# Patient Record
Sex: Male | Born: 1995
Health system: Southern US, Community
[De-identification: ages and names within clinical notes are randomized; demographics above are authoritative.]

## PROBLEM LIST (undated history)

## (undated) DIAGNOSIS — R809 Proteinuria, unspecified: Secondary | ICD-10-CM

## (undated) DIAGNOSIS — Z87898 Personal history of other specified conditions: Secondary | ICD-10-CM

## (undated) HISTORY — DX: Proteinuria, unspecified: R80.9

## (undated) HISTORY — DX: Personal history of other specified conditions: Z87.898

---
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
## 2014-03-03 ENCOUNTER — Telehealth: Payer: Self-pay | Admitting: General Practice

## 2014-03-03 NOTE — Telephone Encounter (Signed)
She will have to bring the form to the appt. Typically these forms are too detailed to complete at the visit. However, Dr. Reece AgarG will get it done and I will have to call her once it's completed.  Would you mind letting her know?

## 2014-03-03 NOTE — Telephone Encounter (Signed)
LVM for Mom Nancy to call 

## 2014-03-03 NOTE — Telephone Encounter (Signed)
LVM for Mom Harriett Sineancy to call

## 2014-03-03 NOTE — Telephone Encounter (Signed)
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 He has a history of epilepsy but has not had any issues in several years and this form needs to be done within 30 days. Mom is requesting a call back to make sure this is ok. I can call her if needed.

## 2014-03-11 ENCOUNTER — Encounter: Payer: Self-pay | Admitting: Family Medicine

## 2014-03-11 ENCOUNTER — Ambulatory Visit (INDEPENDENT_AMBULATORY_CARE_PROVIDER_SITE_OTHER): Payer: BC Managed Care – PPO | Admitting: Family Medicine

## 2014-03-11 ENCOUNTER — Encounter (INDEPENDENT_AMBULATORY_CARE_PROVIDER_SITE_OTHER): Payer: Self-pay

## 2014-03-11 VITALS — BP 100/60 | HR 84 | Temp 98.4°F | Ht 73.25 in | Wt 171.0 lb

## 2014-03-11 DIAGNOSIS — Z113 Encounter for screening for infections with a predominantly sexual mode of transmission: Secondary | ICD-10-CM

## 2014-03-11 DIAGNOSIS — J3089 Other allergic rhinitis: Secondary | ICD-10-CM | POA: Insufficient documentation

## 2014-03-11 DIAGNOSIS — Z8669 Personal history of other diseases of the nervous system and sense organs: Secondary | ICD-10-CM

## 2014-03-11 DIAGNOSIS — Z87898 Personal history of other specified conditions: Secondary | ICD-10-CM | POA: Insufficient documentation

## 2014-03-11 DIAGNOSIS — R809 Proteinuria, unspecified: Secondary | ICD-10-CM

## 2014-03-11 DIAGNOSIS — J309 Allergic rhinitis, unspecified: Secondary | ICD-10-CM

## 2014-03-11 DIAGNOSIS — Z23 Encounter for immunization: Secondary | ICD-10-CM

## 2014-03-11 DIAGNOSIS — Z0000 Encounter for general adult medical examination without abnormal findings: Secondary | ICD-10-CM

## 2014-03-11 LAB — POCT URINALYSIS DIPSTICK
Bilirubin, UA: NEGATIVE
Blood, UA: NEGATIVE
Glucose, UA: NEGATIVE
Ketones, UA: NEGATIVE
Leukocytes, UA: NEGATIVE
Nitrite, UA: NEGATIVE
Protein, UA: NEGATIVE
Spec Grav, UA: 1.025
Urobilinogen, UA: NEGATIVE
pH, UA: 6.5

## 2014-03-11 NOTE — Assessment & Plan Note (Addendum)
Preventative protocols reviewed and updated unless pt declined. Flu and meningitis shots today. Discussed healthy diet and lifestyle.  With mom out of room denies significant alcohol use. High risk sexual activity - discussed. [facility]. Will return for HIV testing. Will return for fasting cbg, FLP.

## 2014-03-11 NOTE — Assessment & Plan Note (Signed)
rec claritin D or zyrtec D. Prone to nose bleeds so will not prescribe INS

## 2014-03-11 NOTE — Progress Notes (Signed)
Pre visit review using our clinic review tool, if applicable. No additional management support is needed unless otherwise documented below in the visit note. 

## 2014-03-11 NOTE — Assessment & Plan Note (Signed)
Seizure free since 2008, will fill out DMV paperwork today.

## 2014-03-11 NOTE — Patient Instructions (Addendum)
Flu shot today Meningitis shot today. For yearlong allergies - try zyrtec D or claritin D daily along with nasal saline irrigation. Come in fasting for labs to check sugar and cholesterol. Return as needed or in 1 year for next checkup

## 2014-03-11 NOTE — Progress Notes (Signed)
BP 100/60 mmHg  Pulse 84  Temp(Src) 98.4 F (36.9 C) (Tympanic)  Ht 6' 1.25" (1.861 m)  Wt 171 lb (77.565 kg)  BMI 22.40 kg/m2  SpO2 98%   CC: new pt to establish care  Subjective:    Patient ID: Kristopher Davidson, male    DOB: 03/23/1996, 18 y.o.   MRN: [id number]  HPI: Kristopher Davidson is a 18 y.o. male presenting on 03/11/2014 for Establish Care   Presents with mom Harriett SineNancy. Prior Pediatrician was Dr Cherie OuchNogo.    H/o complex partial epilepsy onset 2004 started carbamazepine 2007, weaned off carbamazepine 01/2010 per neurology recs ([facility] released [facility]). Seizure free since 2009. Requests DMV form filled out today.   H/o hypoglycemia and proteinuria during middle school, no problems since then.   Constant sinus pressure - takes pseudophed, mucinex, claritin. + RN, sneezing, drainage. Yearlong issues.   Going [employer] - [employer]. Wants to study exercise science.  H/o chicken pox as a 6 mo infant. Never checked titer.  May be due for meningitis shot today.   With mom out of room - denies smoking. Denies rec drugs. No significant alcohol use. Sexually active, 5 partners in last year. Has been tested for CT/GC several months ago. No HIV test in past.   Preventative: Flu shot - today Seat belt use discussed Sunscreen use discussed.   [place]  [employer] studying exercise physiology Activity: gym, track and swimming Diet: good water, fruits/vegetables  From last [facility] note: MRI 1/07 There is no midline shift or mass lesion. There is no evidence of intracranial hemorrhage or infarct. No focal parenchymal signal abnormality is identified. There are no extra-axial fluid collections present. No diffusion weighted signal abnormality is identified. IMPRESSION: Normal MRI of the brain.  Medical/Neurophysiology EEG 04/11/05 This routine EEG, recorded with the patient awake and drowsy, was normal. No epileptiform abnormalities were seen. 9/05 72 hour  EEG normal 9/04 72 hour EEG normal [id number] Routine EEG normal    Relevant past medical, surgical, family and social history reviewed and updated as indicated. Interim medical history since our last visit reviewed. Allergies and medications reviewed and updated.  No current outpatient prescriptions on file prior to visit.   No current facility-administered medications on file prior to visit.    Review of Systems  Constitutional: Negative for fever, chills, activity change, appetite change, fatigue and unexpected weight change.  HENT: Positive for sinus pressure. Negative for hearing loss.   Eyes: Negative for visual disturbance.  Respiratory: Negative for cough, chest tightness, shortness of breath and wheezing.   Cardiovascular: Negative for chest pain, palpitations and leg swelling.  Gastrointestinal: Negative for nausea, vomiting, abdominal pain, diarrhea, constipation, blood in stool and abdominal distention.  Genitourinary: Negative for hematuria and difficulty urinating.  Musculoskeletal: Negative for myalgias, arthralgias and neck pain.  Skin: Negative for rash.  Neurological: Negative for dizziness, seizures, syncope and headaches.  Hematological: Negative for adenopathy. Does not bruise/bleed easily.  Psychiatric/Behavioral: Negative for dysphoric mood. The patient is not nervous/anxious.    Per HPI unless specifically indicated above     Objective:    BP 100/60 mmHg  Pulse 84  Temp(Src) 98.4 F (36.9 C) (Tympanic)  Ht 6' 1.25" (1.861 m)  Wt 171 lb (77.565 kg)  BMI 22.40 kg/m2  SpO2 98%  Wt Readings from Last 3 Encounters:  03/11/14 171 lb (77.565 kg) (74 %*, Z = 0.63)   * Growth percentiles are based on  CDC 2-20 Years data.    Physical Exam  Constitutional: He is oriented to person, place, and time. He appears well-developed and well-nourished. No distress.  HENT:  Head: Normocephalic and atraumatic.  Right Ear: Hearing, tympanic membrane, external ear and ear  canal normal.  Left Ear: Hearing, tympanic membrane, external ear and ear canal normal.  Nose: Nose normal.  Mouth/Throat: Uvula is midline, oropharynx is clear and moist and mucous membranes are normal. No oropharyngeal exudate, posterior oropharyngeal edema or posterior oropharyngeal erythema.  Eyes: Conjunctivae and EOM are normal. Pupils are equal, round, and reactive to light. No scleral icterus.  Neck: Normal range of motion. Neck supple. No thyromegaly present.  Cardiovascular: Normal rate, regular rhythm, normal heart sounds and intact distal pulses.   No murmur heard. Pulses:      Radial pulses are 2+ on the right side, and 2+ on the left side.  Pulmonary/Chest: Effort normal and breath sounds normal. No respiratory distress. He has no wheezes. He has no rales.  Abdominal: Soft. Bowel sounds are normal. He exhibits no distension and no mass. There is no tenderness. There is no rebound and no guarding.  Musculoskeletal: Normal range of motion. He exhibits no edema.  Lymphadenopathy:    He has no cervical adenopathy.  Neurological: He is alert and oriented to person, place, and time.  CN grossly intact, station and gait intact  Skin: Skin is warm and dry. No rash noted.  Psychiatric: He has a normal mood and affect. His behavior is normal. Judgment and thought content normal.  Nursing note and vitals reviewed.  No results found for this or any previous visit.    Assessment & Plan:   Problem List Items Addressed This Visit    Proteinuria    Check today.    Perennial allergic rhinitis    rec claritin D or zyrtec D. Prone to nose bleeds so will not prescribe INS    History of seizure    Seizure free since 2008, will fill out DMV paperwork today.    Health maintenance examination - Primary    Preventative protocols reviewed and updated unless pt declined. Flu and meningitis shots today. Discussed healthy diet and lifestyle.  With mom out of room denies significant alcohol  use. High risk sexual activity - discussed. [facility]. Will return for HIV testing. Will return for fasting cbg, FLP.        Follow up plan: Return in about 3 months (around 06/10/2014), or as needed, for annual exam, prior fasting for blood work.

## 2014-03-11 NOTE — Assessment & Plan Note (Signed)
Check today 

## 2014-03-11 NOTE — Addendum Note (Signed)
Addended by: Sueanne MargaritaSMITH, Lakasha Mcfall L on: 03/11/2014 11:00 AM   Modules accepted: Orders

## 2014-08-12 ENCOUNTER — Encounter: Payer: Self-pay | Admitting: Family Medicine

## 2014-08-12 ENCOUNTER — Ambulatory Visit (INDEPENDENT_AMBULATORY_CARE_PROVIDER_SITE_OTHER): Payer: BLUE CROSS/BLUE SHIELD | Admitting: Family Medicine

## 2014-08-12 VITALS — BP 124/80 | HR 81 | Temp 98.0°F | Wt 169.0 lb

## 2014-08-12 DIAGNOSIS — R369 Urethral discharge, unspecified: Secondary | ICD-10-CM | POA: Diagnosis not present

## 2014-08-12 DIAGNOSIS — Z113 Encounter for screening for infections with a predominantly sexual mode of transmission: Secondary | ICD-10-CM | POA: Diagnosis not present

## 2014-08-12 LAB — POCT URINALYSIS DIPSTICK
Bilirubin, UA: NEGATIVE
Blood, UA: NEGATIVE
Glucose, UA: NEGATIVE
KETONES UA: NEGATIVE
LEUKOCYTES UA: NEGATIVE
Nitrite, UA: NEGATIVE
PH UA: 6
Protein, UA: NEGATIVE
Spec Grav, UA: 1.015
Urobilinogen, UA: 0.2

## 2014-08-12 MED ORDER — CEFTRIAXONE SODIUM 250 MG IJ SOLR
250.0000 mg | Freq: Once | INTRAMUSCULAR | Status: AC
  Administered 2014-08-12: 250 mg via INTRAMUSCULAR

## 2014-08-12 MED ORDER — AZITHROMYCIN 1 G PO PACK: 1.0000 g | PACK | Freq: Once | ORAL | Status: DC

## 2014-08-12 NOTE — Progress Notes (Signed)
Pre visit review using our clinic review tool, if applicable. No additional management support is needed unless otherwise documented below in the visit note. 

## 2014-08-12 NOTE — Addendum Note (Signed)
Addended by: Desmond DikeKNIGHT, Sharne Linders H on: 08/12/2014 03:41 PM   Modules accepted: Orders

## 2014-08-12 NOTE — Addendum Note (Signed)
Addended by: Desmond DikeKNIGHT, Symeon Puleo H on: 08/12/2014 03:51 PM   Modules accepted: Orders

## 2014-08-12 NOTE — Patient Instructions (Addendum)
Rocephin 250mg  today. Take azithromycin slurry as well (sent to pharmacy) Have GF tested prior to relations. Urine checked today. Let us know if this doesn't resolve symptoms.

## 2014-08-12 NOTE — Assessment & Plan Note (Addendum)
Despite reported compliance with condom use. Concern for chlamydia/gonorrhea. Check CT/GC urethral swab, treat with rocephin 250mg  IM and azithromycin 1gm slurry. Check HIV/RPR as well. Discussed safe sex.  No evidence of pediculosis pubis today.

## 2014-08-12 NOTE — Progress Notes (Addendum)
   BP 124/80 mmHg  Pulse 81  Temp(Src) 98 F (36.7 C) (Oral)  Wt 169 lb (76.658 kg)  SpO2 98%   CC: STD check  Subjective:    Patient ID: Kristopher Davidson, male    DOB: 12/03/1995, 19 y.o.   MRN: [id number]  HPI: Kristopher Blewyler Scott Neville is a 19 y.o. male presenting on 08/12/2014 for Penile Discharge and Pruritis   "I think I have STD". 2wk h/o urethral discharge, dysuria, itching, pain with ejaculation. Some urgency. New rash on scrotum.   No hematuria, fevers/chills, abd pain, nausea.   Currently sexually active - 1 partner in past year (GF). they broke up briefly and she had 2 partners besides him, then they hooked up again. He endorses regular condom use.  No h/o STDs in past.  Relevant past medical, surgical, family and social history reviewed and updated as indicated. Interim medical history since our last visit reviewed. Allergies and medications reviewed and updated. No current outpatient prescriptions on file prior to visit.   No current facility-administered medications on file prior to visit.    Review of Systems Per HPI unless specifically indicated above     Objective:    BP 124/80 mmHg  Pulse 81  Temp(Src) 98 F (36.7 C) (Oral)  Wt 169 lb (76.658 kg)  SpO2 98%  Wt Readings from Last 3 Encounters:  08/12/14 169 lb (76.658 kg) (70 %*, Z = 0.51)  03/11/14 171 lb (77.565 kg) (74 %*, Z = 0.63)   * Growth percentiles are based on CDC 2-20 Years data.    Physical Exam  Constitutional: He appears well-developed and well-nourished. No distress.  Abdominal: Hernia confirmed negative in the right inguinal area and confirmed negative in the left inguinal area.  Genitourinary: Testes normal and penis normal. Right testis shows no mass, no swelling and no tenderness. Right testis is descended. Left testis shows no mass, no swelling and no tenderness. Left testis is descended. Circumcised. No penile erythema. No discharge found.  Erythema around scrotum with pruritis  but no lice noted No discharge present. Urethral swab collected.  Lymphadenopathy:       Right: No inguinal adenopathy present.       Left: No inguinal adenopathy present.  Nursing note and vitals reviewed.     Assessment & Plan:   Problem List Items Addressed This Visit    Urethral discharge - Primary    Despite reported compliance with condom use. Concern for chlamydia/gonorrhea. Check CT/GC urethral swab, treat with rocephin 250mg  IM and azithromycin 1gm slurry. Check HIV/RPR as well. Discussed safe sex.  No evidence of pediculosis pubis today.      Relevant Orders   GC/Chlamydia Probe Amp    Other Visit Diagnoses    Screen for STD (sexually transmitted disease)        Relevant Orders    HIV antibody    RPR        Follow up plan: Return if symptoms worsen or fail to improve.

## 2014-08-13 LAB — GC/CHLAMYDIA PROBE AMP
CT Probe RNA: NEGATIVE
GC Probe RNA: NEGATIVE

## 2014-08-14 ENCOUNTER — Emergency Department
Admission: EM | Admit: 2014-08-14 | Discharge: 2014-08-14 | Disposition: A | Discharge status: Home / Self Care | Payer: BLUE CROSS/BLUE SHIELD | Attending: Emergency Medicine | Admitting: Emergency Medicine

## 2014-08-14 ENCOUNTER — Emergency Department: Payer: BLUE CROSS/BLUE SHIELD

## 2014-08-14 ENCOUNTER — Encounter: Payer: Self-pay | Admitting: Emergency Medicine

## 2014-08-14 DIAGNOSIS — S0990XA Unspecified injury of head, initial encounter: Secondary | ICD-10-CM

## 2014-08-14 DIAGNOSIS — Y9389 Activity, other specified: Secondary | ICD-10-CM | POA: Diagnosis not present

## 2014-08-14 DIAGNOSIS — S0181XA Laceration without foreign body of other part of head, initial encounter: Secondary | ICD-10-CM | POA: Insufficient documentation

## 2014-08-14 DIAGNOSIS — Z720 Tobacco use: Secondary | ICD-10-CM | POA: Insufficient documentation

## 2014-08-14 DIAGNOSIS — Y9289 Other specified places as the place of occurrence of the external cause: Secondary | ICD-10-CM | POA: Insufficient documentation

## 2014-08-14 DIAGNOSIS — Y998 Other external cause status: Secondary | ICD-10-CM | POA: Diagnosis not present

## 2014-08-14 DIAGNOSIS — W208XXA Other cause of strike by thrown, projected or falling object, initial encounter: Secondary | ICD-10-CM | POA: Diagnosis not present

## 2014-08-14 MED ORDER — LIDOCAINE-EPINEPHRINE-TETRACAINE (LET) SOLUTION
NASAL | Status: AC
  Filled 2014-08-14: qty 3

## 2014-08-14 MED ORDER — TRAMADOL HCL 50 MG PO TABS
50.0000 mg | ORAL_TABLET | Freq: Once | ORAL | Status: AC
  Administered 2014-08-14: 50 mg via ORAL

## 2014-08-14 MED ORDER — TRAMADOL HCL 50 MG PO TABS
ORAL_TABLET | ORAL | Status: AC
  Filled 2014-08-14: qty 1

## 2014-08-14 MED ORDER — TRAMADOL HCL 50 MG PO TABS: 50.0000 mg | ORAL_TABLET | Freq: Three times a day (TID) | ORAL | Status: DC | PRN

## 2014-08-14 MED ORDER — LIDOCAINE-EPINEPHRINE-TETRACAINE (LET) SOLUTION
3.0000 mL | Freq: Once | NASAL | Status: AC
  Administered 2014-08-14: 3 mL via TOPICAL

## 2014-08-14 NOTE — ED Provider Notes (Signed)
[facility] Emergency Department Provider Note  ____________________________________________  Time seen: Approximately 7:24 PM  I have reviewed the triage vital signs and the nursing notes.   HISTORY  Chief Complaint Head Injury   HPI Kristopher Davidson is a 19 y.o. male presents to the ER status post head injury. Patient reports he and another friend were cutting down limbs off of a tree and approximately 6 inch diameter heavy limb fell, swelling across the branch and hit patient in forehead. Patient states that he was immediately dazed but denies loss of consciousness. Reports headache since incident. Reports incident occurred just prior to arrival. Denies other pain or injury. Patient reports also with mild intermittent nausea. Reports initially he had some dizziness that that is now resolved. Denies vision changes. Denies vomiting, diarrhea, neck pain, back pain or abdominal pain. Reports laceration obtained injury. States pain currently at 7 out of 10 throbbing frontal headache.   Past Medical History  Diagnosis Date  . History of seizure 2011    partial complex seizures, none since 2008, off meds ([facility])  . Proteinuria   . Seizures     Patient Active Problem List   Diagnosis Date Noted  . Urethral discharge 08/12/2014  . Health maintenance examination 03/11/2014  . Perennial allergic rhinitis 03/11/2014  . History of seizure   . Proteinuria     Past Surgical History  Procedure Laterality Date  . Tympanostomy Bilateral 1997    x2  . Tonsillectomy and adenoidectomy  2004    Current Outpatient Rx  Name  Route  Sig  Dispense  Refill              REports tetanus up to date: less than 5 years ago.  Allergies Shellfish allergy and Sulfa antibiotics  Family History  Problem Relation Age of Onset  . Cancer Mother     breast  . Cancer Maternal Grandmother     breast  . Cancer Maternal Aunt     skin  . Diabetes Maternal  Grandfather     and paternal grandparents  . Hypertension Maternal Grandfather   . Hypertension Paternal Grandfather   . CAD Maternal Grandfather [id number]    MI    Social History History  Substance Use Topics  . Smoking status: Current Some Day Smoker    Types: Cigarettes  . Smokeless tobacco: Current User  . Alcohol Use: 1.8 oz/week    0 Standard drinks or equivalent, 3 Cans of beer per week    Review of Systems Constitutional: No fever/chills Eyes: No visual changes. ENT: No sore throat. Cardiovascular: Denies chest pain. Respiratory: Denies shortness of breath. Gastrointestinal: No abdominal pain.  No nausea, no vomiting.  No diarrhea.  No constipation. Genitourinary: Negative for dysuria. Musculoskeletal: Negative for back pain. Skin: Negative for rash. Neurological: Negative  focal weakness or numbness. Positive for headaches.  10-point ROS otherwise negative.  ____________________________________________   PHYSICAL EXAM:  VITAL SIGNS: ED Triage Vitals  Enc Vitals Group     BP 08/14/14 1734 130/75 mmHg     Pulse Rate 08/14/14 1734 81     Resp 08/14/14 1734 20     Temp 08/14/14 1734 98.7 F (37.1 C)     Temp Source 08/14/14 1734 Oral     SpO2 08/14/14 1734 99 %     Weight 08/14/14 1734 170 lb (77.111 kg)     Height 08/14/14 1734 6\' 2"  (1.88 m)     Head Cir --  Peak Flow --      Pain Score 08/14/14 1735 7     Pain Loc --      Pain Edu? --      Excl. in GC? --     Constitutional: Alert and oriented. Well appearing and in no acute distress. Eyes: Conjunctivae are normal. PERRL. EOMI. Head: Atraumatic. Nose: No congestion/rhinnorhea. Mouth/Throat: Mucous membranes are moist.  Oropharynx non-erythematous. Neck: No stridor.  No cervical spine tenderness to palpation. Hematological/Lymphatic/Immunilogical: No cervical lymphadenopathy. Cardiovascular: Normal rate, regular rhythm. Grossly normal heart sounds.  Good peripheral circulation. Respiratory: Normal  respiratory effort.  No retractions. Lungs CTAB. Gastrointestinal: Soft and nontender. No distention. No abdominal bruits. No CVA tenderness. Musculoskeletal: No lower extremity tenderness nor edema.  No joint effusions. Neurologic:  Normal speech and language. No gross focal neurologic deficits are appreciated. Speech is normal. No gait instability.CN 2-12  Grossly intact.  Skin:  Skin is warm, dry and intact. No rash noted. 2 cm laceration to forehead. Mild tender to palpation. No swelling. Clean wound.No active bleeding. With surrounding abrasions Psychiatric: Mood and affect are normal. Speech and behavior are normal.  ____________________________________________ _________________________________________  RADIOLOGY CT HEAD WITHOUT CONTRAST  TECHNIQUE: Contiguous axial images were obtained from the base of the skull through the vertex without intravenous contrast.  COMPARISON: None.  FINDINGS: Very minimal amount of subcutaneous stranding about the left side of the forehead (image 6, series 2). This finding is without associated radiopaque foreign body or displaced calvarial fracture.  Gray-white differentiation is maintained. No CT evidence acute large territory infarct. No intraparenchymal or extra-axial mass or hemorrhage. Normal size and configuration of the ventricles and basilar cisterns. No midline shift. Limited visualization the paranasal sinuses and mastoid air cells is normal. No air-fluid levels.  IMPRESSION: Very minimal amount of soft tissue swelling about the left side of the forehead without associated radiopaque foreign body, displaced calvarial fracture or acute intracranial process.   Electronically Signed  By: Simonne Come M.D.  On: 08/14/2014 20:38  2120: Verified with Dr Grace Isaac radiology NO displaced calvarial fracture or acute intracranial process.  ____________________________________________   PROCEDURES  Procedure(s)  performed: LACERATION REPAIR Performed by: Renford Dills Authorized by: Renford Dills Consent: Verbal consent obtained. Risks and benefits: risks, benefits and alternatives were discussed Consent given by: patient Patient identity confirmed: provided demographic data Prepped and Draped in normal sterile fashion Wound explored  Laceration Location: forehead  Laceration Length: 2 cm  No Foreign Bodies seen or palpated  Anesthesia: local infiltration  Local anesthetic: LET  Irrigation method: syringe Amount of cleaning: standard  Skin closure: 6-0 nylon  Number of sutures: 2  Technique: simple interrupted  Patient tolerance: Patient tolerated the procedure well with no immediate complications. ____________________________________________   INITIAL IMPRESSION / ASSESSMENT AND PLAN / ED COURSE  Pertinent labs & imaging results that were available during my care of the patient were reviewed by me and considered in my medical decision making (see chart for details).  No acute distress. Very well-appearing patient. Presents to the ER with laceration to forehead status post tree branch hitting patient in forehead. CT head negative for acute intracranial injury or fracture. Patient to rest and avoid strenuous activity. Patient to avoid contact sports for at least 2 weeks. Discussed the patient to follow up with primary care physician prior to resuming full activity. Patient verbalized understanding. Laceration repaired in ER. Patient tolerated well. Return to the ER and 7 days for suture removal. Patient verbalized understanding to plan. Discussed  return parameters. ____________________________________________   FINAL CLINICAL IMPRESSION(S) / ED DIAGNOSES  Final diagnoses:  Forehead laceration, initial encounter  Head injury, initial encounter     Renford Dills, NP 08/14/14 2126  Darien Ramus, MD 08/14/14 [phone]

## 2014-08-14 NOTE — ED Notes (Addendum)
Pt presents c/o head injury after he got hit in the head with a large log about 30 minutes ago.  He states that he has a laceration that he estimates to be 1-1/2" long in the center of his forehead which is currently bandaged and not accessible for assessment.  He states that he feels dizzy but has not lost consciousness, and he denies nausea and vomiting.  No meds taken after the injury occurred. He reports numerous similar injuries in the past and is concerned about possibility of a concussion. Pain 7/10 and throbbing, mostly in the area of the forehead.

## 2014-08-14 NOTE — Discharge Instructions (Signed)
Take medication as prescribed. Rest. Avoid strenuous activity. Drink plenty of water. As discussed avoid contact sports or potential for re-head injury for at least 2 weeks. Prior to resuming full activity follow-up with primary care physician.  Follow-up with primary care physician next week.  Return to the ER for new or worsening concerns.  Facial Laceration  A facial laceration is a cut on the face. These injuries can be painful and cause bleeding. Lacerations usually heal quickly, but they need special care to reduce scarring. DIAGNOSIS  Your health care provider will take a medical history, ask for details about how the injury occurred, and examine the wound to determine how deep the cut is. TREATMENT  Some facial lacerations may not require closure. Others may not be able to be closed because of an increased risk of infection. The risk of infection and the chance for successful closure will depend on various factors, including the amount of time since the injury occurred. The wound may be cleaned to help prevent infection. If closure is appropriate, pain medicines may be given if needed. Your health care provider will use stitches (sutures), wound glue (adhesive), or skin adhesive strips to repair the laceration. These tools bring the skin edges together to allow for faster healing and a better cosmetic outcome. If needed, you may also be given a tetanus shot. HOME CARE INSTRUCTIONS  Only take over-the-counter or prescription medicines as directed by your health care provider.  Follow your health care provider's instructions for wound care. These instructions will vary depending on the technique used for closing the wound. For Sutures:  Keep the wound clean and dry.   If you were given a bandage (dressing), you should change it at least once a day. Also change the dressing if it becomes wet or dirty, or as directed by your health care provider.   Wash the wound with soap and water 2  times a day. Rinse the wound off with water to remove all soap. Pat the wound dry with a clean towel.   After cleaning, apply a thin layer of the antibiotic ointment recommended by your health care provider. This will help prevent infection and keep the dressing from sticking.   You may shower as usual after the first 24 hours. Do not soak the wound in water until the sutures are removed.   Get your sutures removed as directed by your health care provider. With facial lacerations, sutures should usually be taken out after 4-5 days to avoid stitch marks.   Wait a few days after your sutures are removed before applying any makeup. For Skin Adhesive Strips:  Keep the wound clean and dry.   Do not get the skin adhesive strips wet. You may bathe carefully, using caution to keep the wound dry.   If the wound gets wet, pat it dry with a clean towel.   Skin adhesive strips will fall off on their own. You may trim the strips as the wound heals. Do not remove skin adhesive strips that are still stuck to the wound. They will fall off in time.  For Wound Adhesive:  You may briefly wet your wound in the shower or bath. Do not soak or scrub the wound. Do not swim. Avoid periods of heavy sweating until the skin adhesive has fallen off on its own. After showering or bathing, gently pat the wound dry with a clean towel.   Do not apply liquid medicine, cream medicine, ointment medicine, or makeup to your  wound while the skin adhesive is in place. This may loosen the film before your wound is healed.   If a dressing is placed over the wound, be careful not to apply tape directly over the skin adhesive. This may cause the adhesive to be pulled off before the wound is healed.   Avoid prolonged exposure to sunlight or tanning lamps while the skin adhesive is in place.  The skin adhesive will usually remain in place for 5-10 days, then naturally fall off the skin. Do not pick at the adhesive film.   After Healing: Once the wound has healed, cover the wound with sunscreen during the day for 1 full year. This can help minimize scarring. Exposure to ultraviolet light in the first year will darken the scar. It can take 1-2 years for the scar to lose its redness and to heal completely.  SEEK IMMEDIATE MEDICAL CARE IF:  You have redness, pain, or swelling around the wound.   You see ayellowish-white fluid (pus) coming from the wound.   You have chills or a fever.  MAKE SURE YOU:  Understand these instructions.  Will watch your condition.  Will get help right away if you are not doing well or get worse. Document Released: 04/18/2004 Document Revised: 12/30/2012 Document Reviewed: 10/22/2012 [facility] Patient Information 2015 [place]. This information is not intended to replace advice given to you by your health care provider. Make sure you discuss any questions you have with your health care provider.  Head Injury You have received a head injury. It does not appear serious at this time. Headaches and vomiting are common following head injury. It should be easy to awaken from sleeping. Sometimes it is necessary for you to stay in the emergency department for a while for observation. Sometimes admission to the hospital may be needed. After injuries such as yours, most problems occur within the first 24 hours, but side effects may occur up to 7-10 days after the injury. It is important for you to carefully monitor your condition and contact your health care provider or seek immediate medical care if there is a change in your condition. WHAT ARE THE TYPES OF HEAD INJURIES? Head injuries can be as minor as a bump. Some head injuries can be more severe. More severe head injuries include:  A jarring injury to the brain (concussion).  A bruise of the brain (contusion). This mean there is bleeding in the brain that can cause swelling.  A cracked skull (skull fracture).  Bleeding in  the brain that collects, clots, and forms a bump (hematoma). WHAT CAUSES A HEAD INJURY? A serious head injury is most likely to happen to someone who is in a car wreck and is not wearing a seat belt. Other causes of major head injuries include bicycle or motorcycle accidents, sports injuries, and falls. HOW ARE HEAD INJURIES DIAGNOSED? A complete history of the event leading to the injury and your current symptoms will be helpful in diagnosing head injuries. Many times, pictures of the brain, such as CT or MRI are needed to see the extent of the injury. Often, an overnight hospital stay is necessary for observation.  WHEN SHOULD I SEEK IMMEDIATE MEDICAL CARE?  You should get help right away if:  You have confusion or drowsiness.  You feel sick to your stomach (nauseous) or have continued, forceful vomiting.  You have dizziness or unsteadiness that is getting worse.  You have severe, continued headaches not relieved by medicine. Only take over-the-counter  or prescription medicines for pain, fever, or discomfort as directed by your health care provider.  You do not have normal function of the arms or legs or are unable to walk.  You notice changes in the black spots in the center of the colored part of your eye (pupil).  You have a clear or bloody fluid coming from your nose or ears.  You have a loss of vision. During the next 24 hours after the injury, you must stay with someone who can watch you for the warning signs. This person should contact local emergency services (911 in the [place] if you have seizures, you become unconscious, or you are unable to wake up. HOW CAN I PREVENT A HEAD INJURY IN THE FUTURE? The most important factor for preventing major head injuries is avoiding motor vehicle accidents. To minimize the potential for damage to your head, it is crucial to wear seat belts while riding in motor vehicles. Wearing helmets while bike riding and playing collision sports (like  football) is also helpful. Also, avoiding dangerous activities around the house will further help reduce your risk of head injury.  WHEN CAN I RETURN TO NORMAL ACTIVITIES AND ATHLETICS? You should be reevaluated by your health care provider before returning to these activities. If you have any of the following symptoms, you should not return to activities or contact sports until 1 week after the symptoms have stopped:  Persistent headache.  Dizziness or vertigo.  Poor attention and concentration.  Confusion.  Memory problems.  Nausea or vomiting.  Fatigue or tire easily.  Irritability.  Intolerant of bright lights or loud noises.  Anxiety or depression.  Disturbed sleep. MAKE SURE YOU:   Understand these instructions.  Will watch your condition.  Will get help right away if you are not doing well or get worse. Document Released: 03/11/2005 Document Revised: 03/16/2013 Document Reviewed: 11/16/2012 [facility] Patient Information 2015 [place], [place]. This information is not intended to replace advice given to you by your health care provider. Make sure you discuss any questions you have with your health care provider.

## 2014-08-14 NOTE — ED Notes (Signed)
Pt HR not 31 at this time, charted in error.

## 2014-08-15 ENCOUNTER — Telehealth: Payer: Self-pay | Admitting: Family Medicine

## 2014-08-15 ENCOUNTER — Other Ambulatory Visit: Payer: BLUE CROSS/BLUE SHIELD

## 2014-08-15 NOTE — Telephone Encounter (Signed)
Please return call for pt on mobile thanks

## 2014-08-15 NOTE — Telephone Encounter (Signed)
Pt returned call please call back [phone]

## 2014-08-15 NOTE — Telephone Encounter (Signed)
Left message for patient to return call.

## 2014-08-15 NOTE — Telephone Encounter (Signed)
Patient notified of lab results. See result note.  

## 2014-08-16 LAB — RPR

## 2014-08-16 LAB — HIV ANTIBODY (ROUTINE TESTING W REFLEX): HIV: NONREACTIVE

## 2014-08-17 ENCOUNTER — Telehealth: Payer: Self-pay | Admitting: Family Medicine

## 2014-08-17 NOTE — Telephone Encounter (Signed)
Pt called and stated that he needs to be tested for ADHD. Please call pt back on mobile number, thanks

## 2014-08-18 ENCOUNTER — Encounter: Payer: Self-pay | Admitting: Family Medicine

## 2014-08-18 NOTE — Telephone Encounter (Signed)
Patient notified and appt scheduled.

## 2014-08-18 NOTE — Telephone Encounter (Signed)
rec schedule appt for eval. 

## 2014-08-29 ENCOUNTER — Ambulatory Visit: Payer: BLUE CROSS/BLUE SHIELD | Admitting: Family Medicine

## 2014-08-29 DIAGNOSIS — Z0289 Encounter for other administrative examinations: Secondary | ICD-10-CM

## 2014-08-30 ENCOUNTER — Telehealth: Payer: Self-pay | Admitting: Family Medicine

## 2014-08-30 NOTE — Telephone Encounter (Signed)
Patient did not come in for their appointment yesterday for ADHD referral.  Please let me know if patient needs to be contacted immediately for follow up or no follow up needed.

## 2014-09-01 NOTE — Telephone Encounter (Signed)
No need to reschedule for now. 

## 2015-02-24 ENCOUNTER — Ambulatory Visit: Payer: BLUE CROSS/BLUE SHIELD | Admitting: Internal Medicine

## 2015-02-24 ENCOUNTER — Encounter: Payer: Self-pay | Admitting: Family Medicine

## 2015-02-24 ENCOUNTER — Ambulatory Visit (INDEPENDENT_AMBULATORY_CARE_PROVIDER_SITE_OTHER): Payer: BLUE CROSS/BLUE SHIELD | Admitting: Family Medicine

## 2015-02-24 VITALS — BP 100/60 | HR 66 | Temp 98.1°F | Ht 73.25 in | Wt 190.5 lb

## 2015-02-24 DIAGNOSIS — J111 Influenza due to unidentified influenza virus with other respiratory manifestations: Secondary | ICD-10-CM | POA: Insufficient documentation

## 2015-02-24 DIAGNOSIS — R21 Rash and other nonspecific skin eruption: Secondary | ICD-10-CM | POA: Diagnosis not present

## 2015-02-24 DIAGNOSIS — J069 Acute upper respiratory infection, unspecified: Secondary | ICD-10-CM | POA: Diagnosis not present

## 2015-02-24 DIAGNOSIS — Z113 Encounter for screening for infections with a predominantly sexual mode of transmission: Secondary | ICD-10-CM | POA: Insufficient documentation

## 2015-02-24 DIAGNOSIS — Z23 Encounter for immunization: Secondary | ICD-10-CM

## 2015-02-24 DIAGNOSIS — B9789 Other viral agents as the cause of diseases classified elsewhere: Secondary | ICD-10-CM

## 2015-02-24 NOTE — Patient Instructions (Addendum)
Stop at lab on way out. Quit smoking.  Mucinex DM twice daily.   Flonase 2 sprays per nostril daily. Call if fever or if not improving as expect in next 5-7 days. Start cetaphil cream daily after bathing. For itching start hydrocortisone ( cortisone 10) cream twice daily x 2 weeks.

## 2015-02-24 NOTE — Addendum Note (Signed)
Addended by: Damita LackLORING, Wendel Homeyer S on: 02/24/2015 08:58 AM   Modules accepted: Orders

## 2015-02-24 NOTE — Assessment & Plan Note (Signed)
Likely dry skin and k. Pilaris . Treat with moisturizing cream and low dose topical steroid.

## 2015-02-24 NOTE — Progress Notes (Signed)
Pre visit review using our clinic review tool, if applicable. No additional management support is needed unless otherwise documented below in the visit note. 

## 2015-02-24 NOTE — Assessment & Plan Note (Signed)
Symptomatic care 

## 2015-02-24 NOTE — Assessment & Plan Note (Signed)
Eval with urine probe and labs.  Nml exam.

## 2015-02-24 NOTE — Progress Notes (Signed)
Subjective:    Patient ID: Kristopher Davidson, male    DOB: 08/29/1995, 19 y.o.   MRN: [id number]  19 year old male presents with multiple issues.  Cough This is a new problem. The current episode started in the past 7 days. The problem has been unchanged. The problem occurs every few minutes. The cough is non-productive. Associated symptoms include ear congestion, headaches, nasal congestion, rhinorrhea and a sore throat. Pertinent negatives include no chills, ear pain, fever, myalgias, postnasal drip, shortness of breath or wheezing. Associated symptoms comments: No sinus pain. Exacerbated by: not keeping him up at night. Risk factors for lung disease include smoking/tobacco exposure (dip and tobacco). He has tried nothing for the symptoms. His past medical history is significant for environmental allergies. There is no history of asthma, bronchiectasis, COPD, emphysema or pneumonia.   Desires STD screening. No known exposure.  No ulcers genitally, no rash   New rash on bilateral arms in last 3 weeks, Tried lotion without benefit,,  Works with [employer].  Mildly itchy. No blisters, no pustules.  No fever.    Review of Systems  Constitutional: Negative for fever and chills.  HENT: Positive for rhinorrhea and sore throat. Negative for ear pain and postnasal drip.   Respiratory: Positive for cough. Negative for shortness of breath and wheezing.   Musculoskeletal: Negative for myalgias.  Allergic/Immunologic: Positive for environmental allergies.  Neurological: Positive for headaches.       Objective:   Physical Exam  Constitutional: Vital signs are normal. He appears well-developed and well-nourished.  Non-toxic appearance. He does not appear ill. No distress.  HENT:  Head: Normocephalic and atraumatic.  Right Ear: Hearing, tympanic membrane, external ear and ear canal normal. No tenderness. No foreign bodies. Tympanic membrane is not retracted and not bulging.  Left Ear:  Hearing, tympanic membrane, external ear and ear canal normal. No tenderness. No foreign bodies. Tympanic membrane is not retracted and not bulging.  Nose: Nose normal. No mucosal edema or rhinorrhea. Right sinus exhibits no maxillary sinus tenderness and no frontal sinus tenderness. Left sinus exhibits no maxillary sinus tenderness and no frontal sinus tenderness.  Mouth/Throat: Uvula is midline, oropharynx is clear and moist and mucous membranes are normal. Normal dentition. No dental caries. No oropharyngeal exudate or tonsillar abscesses.  Eyes: Conjunctivae, EOM and lids are normal. Pupils are equal, round, and reactive to light. Lids are everted and swept, no foreign bodies found.  Neck: Trachea normal, normal range of motion and phonation normal. Neck supple. Carotid bruit is not present. No thyroid mass and no thyromegaly present.  Cardiovascular: Normal rate, regular rhythm, S1 normal, S2 normal, normal heart sounds, intact distal pulses and normal pulses.  Exam reveals no gallop.   No murmur heard. Pulmonary/Chest: Effort normal and breath sounds normal. No respiratory distress. He has no wheezes. He has no rhonchi. He has no rales.  Abdominal: Soft. Normal appearance and bowel sounds are normal. There is no hepatosplenomegaly. There is no tenderness. There is no rebound, no guarding and no CVA tenderness. No hernia. Hernia confirmed negative in the right inguinal area and confirmed negative in the left inguinal area.  Genitourinary: Testes normal and penis normal.  Neurological: He is alert. He has normal reflexes.  Skin: Skin is warm, dry and intact. No rash noted.  Psychiatric: He has a normal mood and affect. His speech is normal and behavior is normal. Judgment normal.          Assessment & Plan:

## 2015-02-25 LAB — HEPATITIS PANEL, ACUTE
HCV Ab: NEGATIVE
Hep A IgM: NONREACTIVE
Hep B C IgM: NONREACTIVE
Hepatitis B Surface Ag: NEGATIVE

## 2015-02-25 LAB — RPR

## 2015-02-27 LAB — GC/CHLAMYDIA PROBE AMP, URINE
Chlamydia, Swab/Urine, PCR: NOT DETECTED
GC PROBE AMP, URINE: NOT DETECTED

## 2015-02-27 LAB — HIV ANTIBODY (ROUTINE TESTING W REFLEX): HIV 1&2 Ab, 4th Generation: NONREACTIVE

## 2015-05-05 ENCOUNTER — Ambulatory Visit: Payer: BLUE CROSS/BLUE SHIELD | Admitting: Family Medicine

## 2015-05-09 ENCOUNTER — Ambulatory Visit (INDEPENDENT_AMBULATORY_CARE_PROVIDER_SITE_OTHER): Payer: BLUE CROSS/BLUE SHIELD | Admitting: Family Medicine

## 2015-05-09 ENCOUNTER — Encounter: Payer: Self-pay | Admitting: Family Medicine

## 2015-05-09 VITALS — BP 128/82 | HR 82 | Temp 97.4°F | Wt 183.5 lb

## 2015-05-09 DIAGNOSIS — Z113 Encounter for screening for infections with a predominantly sexual mode of transmission: Secondary | ICD-10-CM

## 2015-05-09 DIAGNOSIS — J069 Acute upper respiratory infection, unspecified: Secondary | ICD-10-CM | POA: Diagnosis not present

## 2015-05-09 DIAGNOSIS — B9789 Other viral agents as the cause of diseases classified elsewhere: Principal | ICD-10-CM

## 2015-05-09 LAB — POCT INFLUENZA A/B
INFLUENZA A, POC: NEGATIVE
INFLUENZA B, POC: NEGATIVE

## 2015-05-09 LAB — POCT RAPID STREP A (OFFICE): RAPID STREP A SCREEN: NEGATIVE

## 2015-05-09 MED ORDER — GUAIFENESIN-CODEINE 100-10 MG/5ML PO SYRP: 5.0000 mL | ORAL_SOLUTION | Freq: Three times a day (TID) | ORAL | Status: DC | PRN

## 2015-05-09 NOTE — Addendum Note (Signed)
Addended by: Josph Macho A on: 05/09/2015 05:35 PM   Modules accepted: Orders

## 2015-05-09 NOTE — Progress Notes (Signed)
Pre visit review using our clinic review tool, if applicable. No additional management support is needed unless otherwise documented below in the visit note. 

## 2015-05-09 NOTE — Patient Instructions (Addendum)
We will call you with lab results. I think you have viral infection - respiratory and likely stomach bug too. Antibiotics are not needed for this.  Viral infections usually take 7-10 days to resolve.  The cough can last a few weeks to go away. May continue ibuprofen, nyquil. Push fluids and plenty of rest. Bland diet until appetite picks up. Please return if you are not improving as expected, or if you have high fevers (>101.5) or difficulty swallowing or worsening productive cough.  Call clinic with questions.  Good to see you today. I hope you start feeling better soon.

## 2015-05-09 NOTE — Assessment & Plan Note (Addendum)
Anticipate viral given story and exam (URI and possible mild gastroenteritis). Treat with cheratussin. Supportive care reviewed. Update if not improving with treatment plan.  RST negative Flu swab negative

## 2015-05-09 NOTE — Assessment & Plan Note (Signed)
Discussed safe sex. STD screen today (CT/GC urine, HIV/RPR blood).

## 2015-05-09 NOTE — Progress Notes (Signed)
BP 128/82 mmHg  Pulse 82  Temp(Src) 97.4 F (36.3 C) (Oral)  Wt 183 lb 8 oz (83.235 kg)  SpO2 98%   CC: cough, STD screen Subjective:    Patient ID: Kristopher Davidson, male    DOB: March 25, 1996, 20 y.o.   MRN: [id number]  HPI: Kristopher Davidson is a 20 y.o. male presenting on 05/09/2015 for Cough and Wants STD testing   Sick for last 2 months - off and on URI sxs. Felt well last week. Over last 2 days feeling worse - congestion, dry cough, chills, diarrhea, ST with PNdrainage. + fatigued.   No fever.  Treating with ibuprofen, nyquil, cough suppressant. + sick contacts at home.  No h/o asthma, allergic rhinitis. He did receive flu shot this year.  Roommate sick recently as well.   Requests STD screen. Occasional scrotal itching without rashes. No urethral discharge. 3-4 partners in last year, 1 partner over last 2 months since last STD screen.  Condom use 100%, she's on birth control.   Relevant past medical, surgical, family and social history reviewed and updated as indicated. Interim medical history since our last visit reviewed. Allergies and medications reviewed and updated. No current outpatient prescriptions on file prior to visit.   No current facility-administered medications on file prior to visit.    Review of Systems Per HPI unless specifically indicated in ROS section     Objective:    BP 128/82 mmHg  Pulse 82  Temp(Src) 97.4 F (36.3 C) (Oral)  Wt 183 lb 8 oz (83.235 kg)  SpO2 98%  Wt Readings from Last 3 Encounters:  05/09/15 183 lb 8 oz (83.235 kg)  02/24/15 190 lb 8 oz (86.41 kg)  08/14/14 170 lb (77.111 kg) (71 %*, Z = 0.55)   * Growth percentiles are based on CDC 2-20 Years data.    Physical Exam  Constitutional: He appears well-developed and well-nourished. No distress.  HENT:  Head: Normocephalic and atraumatic.  Right Ear: Hearing, tympanic membrane, external ear and ear canal normal.  Left Ear: Hearing, tympanic membrane, external  ear and ear canal normal.  Nose: Nose normal. No mucosal edema or rhinorrhea. Right sinus exhibits no maxillary sinus tenderness and no frontal sinus tenderness. Left sinus exhibits no maxillary sinus tenderness and no frontal sinus tenderness.  Mouth/Throat: Uvula is midline, oropharynx is clear and moist and mucous membranes are normal. No oropharyngeal exudate, posterior oropharyngeal edema, posterior oropharyngeal erythema or tonsillar abscesses.  Eyes: Conjunctivae and EOM are normal. Pupils are equal, round, and reactive to light. No scleral icterus.  Neck: Normal range of motion. Neck supple.  Cardiovascular: Normal rate, regular rhythm, normal heart sounds and intact distal pulses.   No murmur heard. Pulmonary/Chest: Effort normal and breath sounds normal. No respiratory distress. He has no wheezes. He has no rales.  Abdominal: Soft. Bowel sounds are normal. He exhibits no distension and no mass. There is no tenderness. There is no rebound and no guarding. Hernia confirmed negative in the right inguinal area and confirmed negative in the left inguinal area.  Genitourinary: Testes normal and penis normal. Right testis shows no mass, no swelling and no tenderness. Right testis is descended. Left testis shows no mass, no swelling and no tenderness. Left testis is descended. Circumcised.  Lymphadenopathy:    He has no cervical adenopathy.       Right: No inguinal adenopathy present.       Left: No inguinal adenopathy present.  Skin: Skin is warm and  dry. No rash noted.  Nursing note and vitals reviewed.  Results for orders placed or performed in visit on 02/24/15  HIV antibody  Result Value Ref Range   HIV 1&2 Ab, 4th Generation NONREACTIVE NONREACTIVE  Hepatitis panel, acute  Result Value Ref Range   Hepatitis B Surface Ag NEGATIVE NEGATIVE   HCV Ab NEGATIVE NEGATIVE   Hep B C IgM NON REACTIVE NON REACTIVE   Hep A IgM NON REACTIVE NON REACTIVE  RPR  Result Value Ref Range   RPR Ser  Ql NON REAC NON REAC  GC/chlamydia probe amp, urine  Result Value Ref Range   Chlamydia, Swab/Urine, PCR NOT DETECTED    GC Probe Amp, Urine NOT DETECTED       Assessment & Plan:   Problem List Items Addressed This Visit    Viral URI with cough - Primary    Anticipate viral given story and exam (URI and possible mild gastroenteritis). Treat with cheratussin. Supportive care reviewed. Update if not improving with treatment plan.  RST negative Flu swab negative      Screen for STD (sexually transmitted disease)    Discussed safe sex. STD screen today (CT/GC urine, HIV/RPR blood).      Relevant Orders   HIV antibody   GC/chlamydia probe amp, urine   RPR       Follow up plan: Return if symptoms worsen or fail to improve.

## 2015-05-10 ENCOUNTER — Encounter: Payer: Self-pay | Admitting: *Deleted

## 2015-05-10 LAB — HIV ANTIBODY (ROUTINE TESTING W REFLEX): HIV: NONREACTIVE

## 2015-05-10 LAB — RPR

## 2015-05-11 ENCOUNTER — Telehealth: Payer: Self-pay | Admitting: *Deleted

## 2015-05-11 DIAGNOSIS — J029 Acute pharyngitis, unspecified: Secondary | ICD-10-CM

## 2015-05-11 LAB — GC/CHLAMYDIA PROBE AMP
CT PROBE, AMP APTIMA: NOT DETECTED
GC Probe RNA: NOT DETECTED

## 2015-05-11 NOTE — Telephone Encounter (Signed)
Ordered

## 2015-05-11 NOTE — Telephone Encounter (Signed)
Spoke with patient and he was asking if he could be tested for mono. He said that it was mentioned at his OV, but he wasn't tested for it. He said he is feeling a little better but is still very fatigued. (Test can't be added to what was drawn. He is aware he will have to come back)

## 2015-05-11 NOTE — Telephone Encounter (Signed)
Patient notified and lab appt scheduled.  

## 2015-05-12 ENCOUNTER — Encounter: Payer: Self-pay | Admitting: *Deleted

## 2015-05-12 ENCOUNTER — Other Ambulatory Visit: Payer: BLUE CROSS/BLUE SHIELD

## 2015-05-12 ENCOUNTER — Other Ambulatory Visit (INDEPENDENT_AMBULATORY_CARE_PROVIDER_SITE_OTHER): Payer: BLUE CROSS/BLUE SHIELD

## 2015-05-12 DIAGNOSIS — J029 Acute pharyngitis, unspecified: Secondary | ICD-10-CM

## 2015-05-12 LAB — MONONUCLEOSIS SCREEN: Mono Screen: NEGATIVE

## 2015-08-26 DIAGNOSIS — L255 Unspecified contact dermatitis due to plants, except food: Secondary | ICD-10-CM | POA: Diagnosis not present

## 2016-01-03 ENCOUNTER — Encounter: Payer: Self-pay | Admitting: Family Medicine

## 2016-01-03 ENCOUNTER — Ambulatory Visit (INDEPENDENT_AMBULATORY_CARE_PROVIDER_SITE_OTHER): Payer: BLUE CROSS/BLUE SHIELD | Admitting: Family Medicine

## 2016-01-03 VITALS — BP 126/70 | HR 84 | Temp 98.3°F | Wt 170.0 lb

## 2016-01-03 DIAGNOSIS — R5383 Other fatigue: Secondary | ICD-10-CM

## 2016-01-03 DIAGNOSIS — Z113 Encounter for screening for infections with a predominantly sexual mode of transmission: Secondary | ICD-10-CM | POA: Diagnosis not present

## 2016-01-03 DIAGNOSIS — J029 Acute pharyngitis, unspecified: Secondary | ICD-10-CM | POA: Diagnosis not present

## 2016-01-03 DIAGNOSIS — Z23 Encounter for immunization: Secondary | ICD-10-CM

## 2016-01-03 LAB — POCT RAPID STREP A (OFFICE): Rapid Strep A Screen: NEGATIVE

## 2016-01-03 NOTE — Progress Notes (Signed)
Subjective:    Patient ID: Sharlee Blewyler Scott Hurley, male    DOB: 04/23/1995, 20 y.o.   MRN: [id number]  HPI This is a 20 yo male who presents today with fatigue, swollen glands in throat, dry cough, Some headache and sinus pressure, nasal drainage with yellow color. Taking Dayquil, Nyquil, mucinex and unknown allergy medicine. Most bothersome symptom is fatigue, has been achy. Would like to be tested for mono. No known contacts.   Has had 30-40 sexual partners (male), uses condoms every time. Has been with 2 partners recently and had condom break. No symptoms (dysuria, discharge, pain), no previous std diagnosis. Does not think he has had HPV vaccine series. Is willing to start today.    Has decreased smoking to 1-2 a day. Is no longer attending [employer], is going to [employer] and studying horticulture as well as working at [employer]. Intentional weight loss through diet and exercise.     Past Medical History:  Diagnosis Date  . History of seizure 2011   partial complex seizures, none since 2008, off meds ([facility])  . Proteinuria    Past Surgical History:  Procedure Laterality Date  . TONSILLECTOMY AND ADENOIDECTOMY  2004  . TYMPANOSTOMY Bilateral 1997   x2   Family History  Problem Relation Age of Onset  . Cancer Mother     breast  . Cancer Maternal Grandmother     breast  . Cancer Maternal Aunt     skin  . Diabetes Maternal Grandfather     and paternal grandparents  . Hypertension Maternal Grandfather   . Hypertension Paternal Grandfather   . CAD Maternal Grandfather [id number]    MI   Social History  Substance Use Topics  . Smoking status: Current Some Day Smoker    Types: Cigarettes  . Smokeless tobacco: Current User  . Alcohol use 1.8 oz/week    3 Cans of beer per week      Review of Systems Per HPI    Objective:   Physical Exam  Constitutional: He is oriented to person, place, and time. He appears well-developed and well-nourished. No distress.    HENT:  Head: Normocephalic and atraumatic.  Right Ear: Tympanic membrane, external ear and ear canal normal.  Left Ear: Tympanic membrane, external ear and ear canal normal.  Nose: Mucosal edema and rhinorrhea present.  Mouth/Throat: Uvula is midline. Posterior oropharyngeal erythema present. No oropharyngeal exudate or posterior oropharyngeal edema.  Surgically absent tonsils  Eyes: Conjunctivae are normal.  Neck: Normal range of motion. Neck supple.  Cardiovascular: Normal rate, regular rhythm and normal heart sounds.   Pulmonary/Chest: Effort normal and breath sounds normal.  Lymphadenopathy:    He has cervical adenopathy.  Neurological: He is alert and oriented to person, place, and time.  Skin: Skin is warm and dry. He is not diaphoretic.  Psychiatric: He has a normal mood and affect. His behavior is normal. Judgment and thought content normal.  Vitals reviewed.     BP 126/70 (BP Location: Left Arm, Patient Position: Sitting, Cuff Size: Normal)   Pulse 84   Temp 98.3 F (36.8 C) (Oral)   Wt 170 lb (77.1 kg)   SpO2 96%   BMI 22.28 kg/m  Wt Readings from Last 3 Encounters:  01/03/16 170 lb (77.1 kg)  05/09/15 183 lb 8 oz (83.2 kg)  02/24/15 190 lb 8 oz (86.4 kg)   POCT- rapid strep negative    Assessment & Plan:  1. Sore throat -  likely viral, discussed RTC precautions and symptomatic relief measures - POCT rapid strep A - Epstein-Barr virus VCA antibody panel  2. Fatigue, unspecified type - Epstein-Barr virus VCA antibody panel  3. Screening for STD (sexually transmitted disease) - GC/Chlamydia Probe Amp - HIV antibody - RPR - encouraged safe sex practices every time  4. Need for HPV vaccination - no record of HPV vaccination on EPIC or [place] Immunization database, discussed with patient and he agreed to have series - HPV 9-valent vaccine,Recombinat- second dose in 2 months then third dose in 6 months   Olean Ree, FNP-BC  [facility] at  [facility], [facility]  01/04/2016 8:33 AM

## 2016-01-03 NOTE — Progress Notes (Signed)
Pre visit review using our clinic review tool, if applicable. No additional management support is needed unless otherwise documented below in the visit note. 

## 2016-01-03 NOTE — Progress Notes (Signed)
   Subjective:    Patient ID: Kristopher Davidson, male    DOB: 11/06/1995, 20 y.o.   MRN: [id number]  HPI    Review of Systems     Objective:   Physical Exam        Assessment & Plan:

## 2016-01-03 NOTE — Patient Instructions (Signed)
Make an appointment to come back in 2 months for your second Gardasil shot Always practice safe sex Drink enough fluids and get plenty of rest Your strep test was negative We will call you with your lab results  Viral Infections A virus is a type of germ. Viruses can cause:  Minor sore throats.  Aches and pains.  Headaches.  Runny nose.  Rashes.  Watery eyes.  Tiredness.  Coughs.  Loss of appetite.  Feeling sick to your stomach (nausea).  Throwing up (vomiting).  Watery poop (diarrhea). HOME CARE   Only take medicines as told by your doctor.  Drink enough water and fluids to keep your pee (urine) clear or pale yellow. Sports drinks are a good choice.  Get plenty of rest and eat healthy. Soups and broths with crackers or rice are fine. GET HELP RIGHT AWAY IF:   You have a very bad headache.  You have shortness of breath.  You have chest pain or neck pain.  You have an unusual rash.  You cannot stop throwing up.  You have watery poop that does not stop.  You cannot keep fluids down.  You or your child has a temperature by mouth above 102 F (38.9 C), not controlled by medicine.  Your baby is older than 3 months with a rectal temperature of 102 F (38.9 C) or higher.  Your baby is 783 months old or younger with a rectal temperature of 100.4 F (38 C) or higher. MAKE SURE YOU:   Understand these instructions.  Will watch this condition.  Will get help right away if you are not doing well or get worse.   This information is not intended to replace advice given to you by your health care provider. Make sure you discuss any questions you have with your health care provider.   Document Released: 02/22/2008 Document Revised: 06/03/2011 Document Reviewed: 08/17/2014 Elsevier Interactive Patient Education [employer]2016 Elsevier Inc.

## 2016-01-04 ENCOUNTER — Telehealth: Payer: Self-pay | Admitting: Family Medicine

## 2016-01-04 LAB — HIV ANTIBODY (ROUTINE TESTING W REFLEX): HIV: NONREACTIVE

## 2016-01-04 LAB — EPSTEIN-BARR VIRUS VCA ANTIBODY PANEL
EBV NA IgG: 391 U/mL — ABNORMAL HIGH
EBV VCA IGG: 138 U/mL — AB
EBV VCA IgM: <36 U/mL

## 2016-01-04 LAB — RPR

## 2016-01-04 LAB — GC/CHLAMYDIA PROBE AMP
CT PROBE, AMP APTIMA: NOT DETECTED
GC Probe RNA: NOT DETECTED

## 2016-01-04 NOTE — Telephone Encounter (Signed)
-----   Message from [place], [place] sent at 01/04/2016  1:46 PM EDT ----- Pt was notified of results, he said he's not feeling any better he still has a fever and feels fatigued. He would like to know if there's anything that he can take.

## 2016-01-04 NOTE — Telephone Encounter (Signed)
Please call patient and tell him to give it a couple more days of supportive care- rest fluids, over the counter fever/pain reducer like ibuprofen or acetaminophen and let me know if not better by 01/08/16.

## 2016-01-08 NOTE — Telephone Encounter (Signed)
Pt has been alternating between ibuprofen, tylenol and dayquil. He's not feeling better, he's still coughing, sore throat and sinus pressure. Please advise thanks.

## 2016-01-10 ENCOUNTER — Encounter: Payer: Self-pay | Admitting: Family Medicine

## 2016-01-10 ENCOUNTER — Ambulatory Visit (INDEPENDENT_AMBULATORY_CARE_PROVIDER_SITE_OTHER): Payer: BLUE CROSS/BLUE SHIELD | Admitting: Family Medicine

## 2016-01-10 VITALS — BP 100/60 | HR 89 | Wt 175.0 lb

## 2016-01-10 DIAGNOSIS — J0100 Acute maxillary sinusitis, unspecified: Secondary | ICD-10-CM | POA: Diagnosis not present

## 2016-01-10 MED ORDER — AZITHROMYCIN 250 MG PO TABS
ORAL_TABLET | ORAL | 0 refills | Status: DC
Start: 2016-01-10 — End: 2016-05-24

## 2016-01-10 NOTE — Telephone Encounter (Signed)
Unable to leave message

## 2016-01-10 NOTE — Telephone Encounter (Signed)
Pt was scheduled

## 2016-01-10 NOTE — Progress Notes (Signed)
Subjective:    Patient ID: Kristopher Davidson, male    DOB: 12/25/1995, 20 y.o.   MRN: [id number]  HPI This is a 20 yo male who presents today for follow up of fatigue, fever. Was last seen by me 01/03/16, diagnosed with viral uri. Remains congested, yellow - green nasal drainage, cough which is still dry, some SOB/wheeze with running- has decreased his exercise. Frontal sinus pressure. Left sided ear pain, sore throat, post nasal drainage. Feels feverish, doesn't have a thermometer. Has been taking Nyquil/Dayquil without relief of symptoms. Has had symptoms for several weeks.   Past Medical History:  Diagnosis Date  . History of seizure 2011   partial complex seizures, none since 2008, off meds ([facility])  . Proteinuria    Past Surgical History:  Procedure Laterality Date  . TONSILLECTOMY AND ADENOIDECTOMY  2004  . TYMPANOSTOMY Bilateral 1997   x2   Family History  Problem Relation Age of Onset  . Cancer Mother     breast  . Cancer Maternal Grandmother     breast  . Cancer Maternal Aunt     skin  . Diabetes Maternal Grandfather     and paternal grandparents  . Hypertension Maternal Grandfather   . Hypertension Paternal Grandfather   . CAD Maternal Grandfather [phone]    MI   Social History  Substance Use Topics  . Smoking status: Current Some Day Smoker    Types: Cigarettes  . Smokeless tobacco: Current User  . Alcohol use 1.8 oz/week    3 Cans of beer per week      Review of Systems Per HPI    Objective:   Physical Exam  Constitutional: He is oriented to person, place, and time. He appears well-developed and well-nourished. No distress.  HENT:  Head: Normocephalic and atraumatic.  Right Ear: Tympanic membrane, external ear and ear canal normal.  Left Ear: Tympanic membrane, external ear and ear canal normal.  Nose: Mucosal edema and rhinorrhea present. Right sinus exhibits maxillary sinus tenderness and frontal sinus tenderness. Left sinus  exhibits maxillary sinus tenderness and frontal sinus tenderness.  Mouth/Throat: Uvula is midline. Posterior oropharyngeal erythema present.  Sounds congested  Eyes: Conjunctivae are normal.  Neck: Normal range of motion. Neck supple.  Cardiovascular: Normal rate, regular rhythm and normal heart sounds.   Pulmonary/Chest: Effort normal and breath sounds normal.  Occasional dry cough.   Lymphadenopathy:    He has no cervical adenopathy.  Neurological: He is alert and oriented to person, place, and time.  Skin: Skin is warm and dry. He is not diaphoretic.  Psychiatric: He has a normal mood and affect. His behavior is normal. Judgment and thought content normal.  Vitals reviewed.     BP 100/60   Pulse 89   Wt 175 lb (79.4 kg)   SpO2 97%   BMI 22.93 kg/m  Wt Readings from Last 3 Encounters:  01/10/16 175 lb (79.4 kg)  01/03/16 170 lb (77.1 kg)  05/09/15 183 lb 8 oz (83.2 kg)       Assessment & Plan:  1. Acute maxillary sinusitis, recurrence not specified - Provided written and verbal information regarding diagnosis and treatment. - encouraged daily antihistamine, Afrin x 3 days, otc analgesics, rest, fluids - RTC precautions reviewed - azithromycin (ZITHROMAX) 250 MG tablet; Take two tablets today then one a day for 4 days  Dispense: 6 tablet; Refill: 0   Olean Reeeborah Andon Villard, FNP-BC  [facility], [facility]  01/11/2016 8:28 AM

## 2016-01-10 NOTE — Telephone Encounter (Signed)
Please call patient and see if he is feeling better- I'm not sure if the message was closed without resolution. If he is not better, please have him make an appointment to come in to be rechecked.

## 2016-01-10 NOTE — Patient Instructions (Signed)
Take antibiotic as prescribed Take a daily cetirizine (generic zyrtec) while having drainage Stop nyquil/dayquil For pain, take tylenol or advil Rest, drink lots of fluids

## 2016-02-07 ENCOUNTER — Telehealth: Payer: Self-pay | Admitting: Family Medicine

## 2016-02-07 NOTE — Telephone Encounter (Signed)
Tried to call pt and leave a message per DPR, but his VM is full. I have a letter for him for 12-28-15 and 01-11-16.

## 2016-02-07 NOTE — Telephone Encounter (Signed)
Pt called requesting a Dr. Phoebe SharpsNote for 8/24, 8/31, 10/5, and 10/19. He states he was sick on those dates and needs the note for class by 8am tomorrow or he will fail his class. Please call with any questions.

## 2016-02-07 NOTE — Telephone Encounter (Signed)
Please give him a note for 10/5, 10/19 as those dates were near the time I saw him.

## 2016-03-06 ENCOUNTER — Ambulatory Visit: Payer: BLUE CROSS/BLUE SHIELD

## 2016-05-23 ENCOUNTER — Ambulatory Visit: Payer: BLUE CROSS/BLUE SHIELD | Admitting: Family Medicine

## 2016-05-24 ENCOUNTER — Encounter: Payer: Self-pay | Admitting: *Deleted

## 2016-05-24 ENCOUNTER — Telehealth: Payer: Self-pay | Admitting: Family Medicine

## 2016-05-24 ENCOUNTER — Encounter: Payer: Self-pay | Admitting: Family Medicine

## 2016-05-24 ENCOUNTER — Ambulatory Visit (INDEPENDENT_AMBULATORY_CARE_PROVIDER_SITE_OTHER): Payer: BLUE CROSS/BLUE SHIELD | Admitting: Family Medicine

## 2016-05-24 VITALS — BP 128/76 | HR 88 | Temp 98.6°F | Wt 176.5 lb

## 2016-05-24 DIAGNOSIS — J111 Influenza due to unidentified influenza virus with other respiratory manifestations: Secondary | ICD-10-CM

## 2016-05-24 DIAGNOSIS — Z87898 Personal history of other specified conditions: Secondary | ICD-10-CM

## 2016-05-24 DIAGNOSIS — Z113 Encounter for screening for infections with a predominantly sexual mode of transmission: Secondary | ICD-10-CM

## 2016-05-24 MED ORDER — GUAIFENESIN-CODEINE 100-10 MG/5ML PO SYRP: 5.0000 mL | ORAL_SOLUTION | Freq: Two times a day (BID) | ORAL | 0 refills | Status: DC | PRN

## 2016-05-24 NOTE — Assessment & Plan Note (Signed)
recent illness consistent influenza. Discussed supportive care. Discussed contagious nature of illness.  Treat cough with cheratussin. Discussed universal precautions.

## 2016-05-24 NOTE — Assessment & Plan Note (Signed)
Pt requests this for next week. Endorses monogamous relationship with GF since last check.

## 2016-05-24 NOTE — Progress Notes (Signed)
Pre visit review using our clinic review tool, if applicable. No additional management support is needed unless otherwise documented below in the visit note. 
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
## 2016-05-24 NOTE — Telephone Encounter (Signed)
DMV form filled and placed in Kim's box.

## 2016-05-24 NOTE — Patient Instructions (Addendum)
I think you had flu and are still getting over this.  Treat with cheratussin codeine cough syrup. May take ibuprofen 400-600mg  with meals for aches.  Watch for return of fevers, worsening productive cough, or just not improving as expected.  Return Monday for nurse visit for guardasil and for labs. We will have DMV form ready for you on Monday as well.

## 2016-05-24 NOTE — Assessment & Plan Note (Signed)
H/o partial complex seizures previously followed [facility]. Seizure free since 2008, off AED since 02/2008. Will fill out DMV paperwork and place in Ki[employer]s box.

## 2016-05-27 ENCOUNTER — Ambulatory Visit (INDEPENDENT_AMBULATORY_CARE_PROVIDER_SITE_OTHER): Payer: BLUE CROSS/BLUE SHIELD | Admitting: *Deleted

## 2016-05-27 ENCOUNTER — Other Ambulatory Visit (INDEPENDENT_AMBULATORY_CARE_PROVIDER_SITE_OTHER): Payer: BLUE CROSS/BLUE SHIELD

## 2016-05-27 DIAGNOSIS — Z113 Encounter for screening for infections with a predominantly sexual mode of transmission: Secondary | ICD-10-CM | POA: Diagnosis not present

## 2016-05-27 DIAGNOSIS — Z23 Encounter for immunization: Secondary | ICD-10-CM | POA: Diagnosis not present

## 2016-05-27 NOTE — Telephone Encounter (Signed)
[facility]

## 2016-05-28 LAB — GC/CHLAMYDIA PROBE AMP
CT Probe RNA: NOT DETECTED
GC Probe RNA: NOT DETECTED

## 2016-05-28 LAB — RPR

## 2016-05-28 LAB — HIV ANTIBODY (ROUTINE TESTING W REFLEX): HIV 1&2 Ab, 4th Generation: NONREACTIVE

## 2016-09-09 ENCOUNTER — Telehealth: Payer: Self-pay
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
## 2016-09-09 NOTE — Telephone Encounter (Signed)
Spoke with mom. They request referral out of [place] for counseling.  I will touch base with possible [place] counselor.  They also request CPE scheduled.

## 2016-09-11 NOTE — Telephone Encounter (Signed)
Waiting to hear back from [facility]ri b[facility] at [facility] do you know how far out we are with Kristopher Davidson? And would mom be willing to bring him here if not too far out?

## 2016-09-11 NOTE — Telephone Encounter (Signed)
Called patients mom Kristopher Davidson and she wants the Appt with Kristopher Davidson next Wednesday. [facility]. Kristopher Davidson has an appt with you tomm at 3;00pm so his Mom said to please just give him the New patient paperwork at your office visit. Paperwork will be on your CMA's desk to give to [place] tomorrow.

## 2016-09-12 ENCOUNTER — Encounter: Payer: Self-pay | Admitting: Family Medicine

## 2016-09-12 ENCOUNTER — Ambulatory Visit (INDEPENDENT_AMBULATORY_CARE_PROVIDER_SITE_OTHER): Payer: BLUE CROSS/BLUE SHIELD | Admitting: Family Medicine

## 2016-09-12 VITALS — BP 118/76 | HR 72 | Temp 98.1°F | Ht 73.25 in | Wt 182.2 lb

## 2016-09-12 DIAGNOSIS — L237 Allergic contact dermatitis due to plants, except food: Secondary | ICD-10-CM | POA: Diagnosis not present

## 2016-09-12 DIAGNOSIS — Z113 Encounter for screening for infections with a predominantly sexual mode of transmission: Secondary | ICD-10-CM | POA: Diagnosis not present

## 2016-09-12 DIAGNOSIS — F430 Acute stress reaction: Secondary | ICD-10-CM

## 2016-09-12 DIAGNOSIS — R21 Rash and other nonspecific skin eruption: Secondary | ICD-10-CM

## 2016-09-12 MED ORDER — HYDROXYZINE HCL 25 MG PO TABS: 12.5000 mg | ORAL_TABLET | Freq: Two times a day (BID) | ORAL | 0 refills | Status: DC | PRN

## 2016-09-12 MED ORDER — TRIAMCINOLONE ACETONIDE 0.1 % EX CREA: 1.0000 "application " | TOPICAL_CREAM | Freq: Two times a day (BID) | CUTANEOUS | 0 refills | Status: DC
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
## 2016-09-12 NOTE — Assessment & Plan Note (Signed)
Rx TCI cream, hydroxyzine PRN itching

## 2016-09-12 NOTE — Assessment & Plan Note (Deleted)
STD screen per request today.

## 2016-09-12 NOTE — Assessment & Plan Note (Signed)
Anticipate molluscum, discussed this with patient. Will monitor for now.

## 2016-09-12 NOTE — Patient Instructions (Addendum)
Labs today/urine today For anxiety - try 1/2-1 tablet hydroxyzine as needed. Cream for poison ivy sent to pharmacy.  Keep appointment with psychologist.  Return as needed.  Possible molluscum rash as well.

## 2016-09-13 LAB — HSV(HERPES SIMPLEX VRS) I + II AB-IGG
HSV 1 Glycoprotein G Ab, IgG: <0.9 Index (ref <0.90)
HSV 2 Glycoprotein G Ab, IgG: <0.9 Index (ref <0.90)

## 2016-09-13 LAB — HIV ANTIBODY (ROUTINE TESTING W REFLEX): HIV 1&2 Ab, 4th Generation: NONREACTIVE

## 2016-09-13 LAB — GC/CHLAMYDIA PROBE AMP
CT PROBE, AMP APTIMA: NOT DETECTED
GC PROBE AMP APTIMA: NOT DETECTED

## 2016-09-13 LAB — RPR

## 2016-09-16 ENCOUNTER — Ambulatory Visit (INDEPENDENT_AMBULATORY_CARE_PROVIDER_SITE_OTHER): Payer: BLUE CROSS/BLUE SHIELD | Admitting: Psychology

## 2016-09-16 DIAGNOSIS — F4323 Adjustment disorder with mixed anxiety and depressed mood: Secondary | ICD-10-CM | POA: Diagnosis not present

## 2016-09-17 LAB — HSV 1/2 AB (IGM), IFA W/RFLX TITER
HSV 1 IgM Screen: NEGATIVE
HSV 2 IGM SCREEN: NEGATIVE

## 2016-10-08 ENCOUNTER — Ambulatory Visit (INDEPENDENT_AMBULATORY_CARE_PROVIDER_SITE_OTHER): Payer: BLUE CROSS/BLUE SHIELD | Admitting: Psychology

## 2016-10-08 DIAGNOSIS — F4323 Adjustment disorder with mixed anxiety and depressed mood: Secondary | ICD-10-CM

## 2016-10-18 ENCOUNTER — Ambulatory Visit (INDEPENDENT_AMBULATORY_CARE_PROVIDER_SITE_OTHER): Payer: BLUE CROSS/BLUE SHIELD | Admitting: Psychology

## 2016-10-18 DIAGNOSIS — F4323 Adjustment disorder with mixed anxiety and depressed mood: Secondary | ICD-10-CM | POA: Diagnosis not present

## 2016-11-12 ENCOUNTER — Ambulatory Visit (INDEPENDENT_AMBULATORY_CARE_PROVIDER_SITE_OTHER): Payer: BLUE CROSS/BLUE SHIELD | Admitting: Psychology

## 2016-11-12 DIAGNOSIS — F4323 Adjustment disorder with mixed anxiety and depressed mood: Secondary | ICD-10-CM

## 2016-12-18 ENCOUNTER — Ambulatory Visit (INDEPENDENT_AMBULATORY_CARE_PROVIDER_SITE_OTHER): Payer: BLUE CROSS/BLUE SHIELD | Admitting: Psychology

## 2016-12-18 DIAGNOSIS — F4323 Adjustment disorder with mixed anxiety and depressed mood: Secondary | ICD-10-CM

## 2017-01-05 DIAGNOSIS — R300 Dysuria: Secondary | ICD-10-CM | POA: Diagnosis not present

## 2017-01-05 DIAGNOSIS — A7489 Other chlamydial diseases: Secondary | ICD-10-CM | POA: Diagnosis not present

## 2017-01-09 ENCOUNTER — Ambulatory Visit (INDEPENDENT_AMBULATORY_CARE_PROVIDER_SITE_OTHER): Payer: BLUE CROSS/BLUE SHIELD | Admitting: Psychology

## 2017-01-09 ENCOUNTER — Other Ambulatory Visit: Payer: Self-pay | Admitting: Family Medicine

## 2017-01-09 DIAGNOSIS — F4323 Adjustment disorder with mixed anxiety and depressed mood: Secondary | ICD-10-CM | POA: Diagnosis not present
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
## 2017-01-23 ENCOUNTER — Ambulatory Visit: Payer: Self-pay | Admitting: Psychology

## 2017-03-13 DIAGNOSIS — Z113 Encounter for screening for infections with a predominantly sexual mode of transmission: Secondary | ICD-10-CM | POA: Diagnosis not present

## 2017-03-27 ENCOUNTER — Other Ambulatory Visit: Payer: Self-pay

## 2017-03-27 ENCOUNTER — Encounter: Payer: Self-pay | Admitting: Family Medicine

## 2017-03-27 ENCOUNTER — Ambulatory Visit: Payer: Self-pay | Admitting: Internal Medicine

## 2017-03-27 ENCOUNTER — Ambulatory Visit: Payer: BLUE CROSS/BLUE SHIELD | Admitting: Family Medicine

## 2017-03-27 VITALS — BP 110/60 | HR 82 | Temp 97.3°F | Ht 73.25 in | Wt 179.5 lb

## 2017-03-27 DIAGNOSIS — Z202 Contact with and (suspected) exposure to infections with a predominantly sexual mode of transmission: Secondary | ICD-10-CM

## 2017-03-27 DIAGNOSIS — Z8619 Personal history of other infectious and parasitic diseases: Secondary | ICD-10-CM

## 2017-03-27 DIAGNOSIS — R197 Diarrhea, unspecified: Secondary | ICD-10-CM | POA: Diagnosis not present

## 2017-03-27 DIAGNOSIS — R6889 Other general symptoms and signs: Secondary | ICD-10-CM | POA: Diagnosis not present

## 2017-03-27 LAB — POC INFLUENZA A&B (BINAX/QUICKVUE)
Influenza A, POC: NEGATIVE
Influenza B, POC: NEGATIVE

## 2017-03-27 MED ORDER — HYDROXYZINE HCL 25 MG PO TABS: ORAL_TABLET | ORAL | 0 refills | Status: AC

## 2017-03-27 NOTE — Progress Notes (Signed)
Dr. Karleen Hampshire T. Lititia Sen, MD, CAQ [facility] [address] [place], [address] Phone: [phone] Fax: [phone]  03/27/2017  Patient: Kristopher Davidson, MRN: [id number], DOB: [birth year], 22 y.o.  Primary Physician:  Eustaquio Boyden, MD   Chief Complaint  Patient presents with  . Abdominal Pain  . Nausea  . Diarrhea  . Fever  . Chills  . Extremity Weakness  . Dizziness  . Cough  . STD Screening   Subjective:   Kristopher Davidson presents with runny nose, sneezing, cough, sore throat, malaise, myalgias, arthralgia, chills, and fever.  Aches and pains, fever off and on, abd pain, n/v/d. Started about 3 days ago - all coworkers are sick. Similar, best friend had the flu last week.   Tested positive for chlamydia - got it in October. Had a flare up. Got from an ex girlfriend.  Took Erythro and doxy.   + recent exposure to others with similar symptoms.   The patent denies sore throat as the primary complaint. Denies sthortness of breath/wheezing, otalgia, facial pain, abdominal pain  Generally feels terrible  PMH, PHS, Allergies, Problem List, Medications, Family History, and Social History have all been reviewed.  Patient Active Problem List   Diagnosis Date Noted  . History of chlamydia infection 03/28/2017  . Stress reaction 09/12/2016  . Poison ivy dermatitis 09/12/2016  . Screen for STD (sexually transmitted disease) 02/24/2015  . Groin rash 02/24/2015  . Health maintenance examination 03/11/2014  . Perennial allergic rhinitis 03/11/2014  . History of seizure   . Proteinuria   . Epilepsy, partial (HCC) 09/01/2012    Past Medical History:  Diagnosis Date  . History of seizure 2011   partial complex seizures, none since 2008, off meds (prior saw neurology [facility])  . Proteinuria     Past Surgical History:  Procedure Laterality Date  . TONSILLECTOMY AND ADENOIDECTOMY  2004  . TYMPANOSTOMY Bilateral 1997   x2    Social History   Socioeconomic History  . Marital status: Single    Spouse name: Not on file  . Number of children: Not on file  . Years of education: Not on file  . Highest education level: Not on file  Social Needs  . Financial resource strain: Not on file  . Food insecurity - worry: Not on file  . Food insecurity - inability: Not on file  . Transportation needs - medical: Not on file  . Transportation needs - non-medical: Not on file  Occupational History  . Not on file  Tobacco Use  . Smoking status: Current Some Day Smoker    Types: Cigarettes  . Smokeless tobacco: Current User  Substance and Sexual Activity  . Alcohol use: Yes    Alcohol/week: 1.8 oz    Types: 3 Cans of beer per week  . Drug use: No  . Sexual activity: Yes    Birth control/protection: Condom  Other Topics Concern  . Not on file  Social History Narrative   Lives on campus    [place] studying exercise physiology   Activity: gym, track and swimming   Diet: good water, fruits/vegetables    Family History  Problem Relation Age of Onset  . Cancer Mother        breast  . Cancer Maternal Grandmother        breast  . Cancer Maternal Aunt        skin  . Diabetes Maternal Grandfather  and paternal grandparents  . Hypertension Maternal Grandfather   . Hypertension Paternal Grandfather   . CAD Maternal Grandfather 45       MI    Allergies  Allergen Reactions  . Shellfish Allergy Rash  . Sulfa Antibiotics Rash    Medication list reviewed and updated in full in [facility].  ROS as above, eating and drinking - tolerating PO. Urinating normally. No excessive vomitting or diarrhea. O/w as above.  Objective:   Blood pressure 110/60, pulse 82, temperature (!) 97.3 F (36.3 C), temperature source Oral, height 6' 1.25" (1.861 m), weight 179 lb 8 oz (81.4 kg), SpO2 99 %.  Gen: WDWN, NAD; A & O x3, cooperative. Pleasant.Globally Non-toxic HEENT: Normocephalic and atraumatic. Throat  clear, w/o exudate, R TM clear, L TM - good landmarks, No fluid present. rhinnorhea. No frontal or maxillary sinus T. MMM NECK: Anterior cervical  LAD is absent CV: RRR, No M/G/R, cap refill <2 sec PULM: Breathing comfortably in no respiratory distress. no wheezing, crackles, rhonchi ABD: S,NT,ND,+BS. No HSM. No rebound. EXT: No c/c/e PSYCH: Friendly, good eye contact MSK: Nml gait GU: normal male, no ulcers, no lesions  Results for orders placed or performed in visit on 03/27/17  POC Influenza A&B (Binax test)  Result Value Ref Range   Influenza A, POC Negative Negative   Influenza B, POC Negative Negative    Assessment and Plan:   Flu-like symptoms  Diarrhea, unspecified type - Plan: POC Influenza A&B (Binax test)  Exposure to sexually transmitted disease (STD) - Plan: C. trachomatis/N. gonorrhoeae RNA, HIV antibody, RPR, Trichomonas vaginalis, RNA  History of chlamydia infection   Supportive care, fluids, cough medicines as needed, and anti-pyretics. Infection control emphasized, including OOW or school until AF 24 hours.  STD check, too  Follow-up: No Follow-up on file.  Meds ordered this encounter  Medications  . hydrOXYzine (ATARAX/VISTARIL) 25 MG tablet    Sig: TAKE 1/2 TO 1 TABLET BY MOUTH TWICE A DAY AS NEEDED FOR ANXIETY OR ITCHING    Dispense:  30 tablet    Refill:  0     Orders Placed This Encounter  Procedures  . C. trachomatis/N. gonorrhoeae RNA  . Trichomonas vaginalis, RNA  . HIV antibody  . RPR  . POC Influenza A&B (Binax test)    Signed,  Axle Parfait T. Axle Parfait, MD     Medication List        Accurate as of 03/27/17 11:59 PM. Always use your most recent med list.          hydrOXYzine 25 MG tablet Commonly known as:  ATARAX/VISTARIL TAKE 1/2 TO 1 TABLET BY MOUTH TWICE A DAY AS NEEDED FOR ANXIETY OR ITCHING       Where to Get Your Medications    These medications were sent to [id number] - [place], [place] - [address]  [address], [place] [id number]   Phone:  [phone]   hydrOXYzine 25 MG tablet

## 2017-03-28 ENCOUNTER — Ambulatory Visit: Payer: Self-pay | Admitting: Internal Medicine

## 2017-03-28 DIAGNOSIS — Z8619 Personal history of other infectious and parasitic diseases: Secondary | ICD-10-CM | POA: Insufficient documentation

## 2017-03-28 LAB — C. TRACHOMATIS/N. GONORRHOEAE RNA
C. TRACHOMATIS RNA, TMA: NOT DETECTED
N. GONORRHOEAE RNA, TMA: NOT DETECTED

## 2017-03-28 LAB — RPR: RPR Ser Ql: NONREACTIVE

## 2017-03-28 LAB — HIV ANTIBODY (ROUTINE TESTING W REFLEX): HIV 1&2 Ab, 4th Generation: NONREACTIVE

## 2017-03-28 LAB — TRICHOMONAS VAGINALIS RNA, QL,MALES: TRICHOMONAS VAGINALIS RNA: NOT DETECTED

## 2017-04-01 ENCOUNTER — Telehealth: Payer: Self-pay | Admitting: Family Medicine

## 2017-04-01 MED ORDER — AZITHROMYCIN 250 MG PO TABS: ORAL_TABLET | ORAL | 0 refills | Status: AC

## 2017-04-01 NOTE — Telephone Encounter (Signed)
With his girlfriend testing positive, he should be treated. It is possible (but infrequent) for the test to come back falsely negative.  Azithromycin 250 mg, Take all 4 tablets at once, #4, 0 ref  His other viral symptoms should continue to improve with time - should not have anything to do with a chlamydia infection.

## 2017-04-01 NOTE — Telephone Encounter (Signed)
Nimai notified as instructed by telephone.  [facility] [place] as instructed by Dr. Patsy Lagerop[place].

## 2017-04-01 NOTE — Telephone Encounter (Signed)
Pt was seen on 03/27/17 and pt still has redness and irritation; no drainage from penis. Pt has been having unprotected sex.Pt still has abd pain but diarrhea is gone. Since pt s girlfriend tested positive for chlamydia  Pt [facility] [place]. Pt request cb.

## 2017-04-01 NOTE — Telephone Encounter (Signed)
Copied from CRM [id number]. Topic: General - Other >> Apr 01, 2017  2:47 PM Lelon FrohlichGolden, Niall Illes, [place] wrote: Reason for CRM: pt called and stated that his girlfriend chlamydia screen was positive so he would like to be treated please contact pt at [phone]

## 2017-04-18 DIAGNOSIS — J069 Acute upper respiratory infection, unspecified: Secondary | ICD-10-CM | POA: Diagnosis not present
